# Patient Record
Sex: Male | Born: 1970 | Race: Black or African American | Hispanic: No | Marital: Married | Smoking: Never smoker
Health system: Southern US, Community
[De-identification: ages and names within clinical notes are randomized; demographics above are authoritative.]

## PROBLEM LIST (undated history)

## (undated) DIAGNOSIS — I10 Essential (primary) hypertension: Secondary | ICD-10-CM

## (undated) DIAGNOSIS — E785 Hyperlipidemia, unspecified: Secondary | ICD-10-CM

## (undated) DIAGNOSIS — K219 Gastro-esophageal reflux disease without esophagitis: Secondary | ICD-10-CM

---

## 2010-12-26 ENCOUNTER — Other Ambulatory Visit: Payer: Self-pay

## 2010-12-26 ENCOUNTER — Emergency Department (INDEPENDENT_AMBULATORY_CARE_PROVIDER_SITE_OTHER): Payer: BC Managed Care – PPO

## 2010-12-26 ENCOUNTER — Emergency Department (HOSPITAL_BASED_OUTPATIENT_CLINIC_OR_DEPARTMENT_OTHER)
Admission: EM | Admit: 2010-12-26 | Discharge: 2010-12-26 | Disposition: A | Discharge status: Home / Self Care | Payer: BC Managed Care – PPO | Attending: Emergency Medicine | Admitting: Emergency Medicine

## 2010-12-26 ENCOUNTER — Encounter: Payer: Self-pay | Admitting: *Deleted

## 2010-12-26 DIAGNOSIS — R079 Chest pain, unspecified: Secondary | ICD-10-CM | POA: Insufficient documentation

## 2010-12-26 DIAGNOSIS — R0789 Other chest pain: Secondary | ICD-10-CM

## 2010-12-26 HISTORY — DX: Hyperlipidemia, unspecified: E78.5

## 2010-12-26 LAB — BASIC METABOLIC PANEL
BUN: 13 mg/dL (ref 6–23)
CO2: 27 mEq/L (ref 19–32)
Chloride: 101 mEq/L (ref 96–112)
Glucose, Bld: 111 mg/dL — ABNORMAL HIGH (ref 70–99)
Potassium: 3.6 mEq/L (ref 3.5–5.1)

## 2010-12-26 LAB — CBC
HCT: 40.5 % (ref 39.0–52.0)
Hemoglobin: 13.3 g/dL (ref 13.0–17.0)
WBC: 6.6 10*3/uL (ref 4.0–10.5)

## 2010-12-26 MED ORDER — OMEPRAZOLE 20 MG PO CPDR: 20.0000 mg | DELAYED_RELEASE_CAPSULE | Freq: Every day | ORAL | Status: AC

## 2010-12-26 NOTE — ED Notes (Signed)
Pt presents to ED today with left sided chest pain taht started earlier and has been steady all day.  Pt has previous cardiac work up with no findings.  Pt reports no injury or trauma to chest.

## 2010-12-26 NOTE — ED Notes (Signed)
Pt c/o cp on and off x 2 days, described and cramping denies SOB or nausea

## 2010-12-26 NOTE — ED Provider Notes (Signed)
History     CSN: [id number] Arrival date & time: 12/26/2010  7:21 PM   First MD Initiated Contact with Patient 12/26/10 1928      Chief Complaint  Patient presents with  . Chest Pain    (Consider location/radiation/quality/duration/timing/severity/associated sxs/prior treatment) Patient is a 40 y.o. male presenting with chest pain. The history is provided by the patient and the spouse.  Chest Pain    patient reports occasional ache in the left side of his chest without radiation x2 days.  Today he had an episode that lasted 4-5 minutes.  There was no associated shortness of breath, diaphoresis palpitations, near syncope,  Syncope.  His symptoms are not worsened by exertion.  There is not a position that worsens his pain.  Worsened by palpation.  He's had no trauma to his chest.  He denies coughing congestion fever and chills.  He denies unilateral leg swelling.  His no family history of early cardiac disease his only cardiac risk factors hyperlipidemia.  No history of DVT or PE.  The patient reports a normal stress test 3 years ago done by a cardiologist in [facility].  His symptoms are moderate when they're present.  Currently he is without chest pain.  Past Medical History  Diagnosis Date  . Hyperlipemia     History reviewed. No pertinent past surgical history.  History reviewed. No pertinent family history.  History  Substance Use Topics  . Smoking status: Never Smoker   . Smokeless tobacco: Not on file  . Alcohol Use: No      Review of Systems  Cardiovascular: Positive for chest pain.  All other systems reviewed and are negative.    Allergies  Review of patient's allergies indicates no known allergies.  Home Medications   Current Outpatient Rx  Name Route Sig Dispense Refill  . ONE-DAILY MULTI VITAMINS PO TABS Oral Take 1 tablet by mouth daily.      Marland Kitchen SIMVASTATIN 10 MG PO TABS Oral Take 10 mg by mouth at bedtime.      . OMEPRAZOLE 20 MG PO CPDR  Oral Take 1 capsule (20 mg total) by mouth daily. 30 capsule 0    BP 132/82  Pulse 83  Temp(Src) 98.7 F (37.1 C) (Oral)  Resp 18  Ht 5\' 10"  (1.778 m)  Wt 250 lb (113.399 kg)  BMI 35.87 kg/m2  SpO2 100%  Physical Exam  Nursing note and vitals reviewed. Constitutional: He is oriented to person, place, and time. He appears well-developed and well-nourished.  HENT:  Head: Normocephalic and atraumatic.  Eyes: EOM are normal.  Neck: Normal range of motion.  Cardiovascular: Normal rate, regular rhythm, normal heart sounds and intact distal pulses.   Pulmonary/Chest: Effort normal and breath sounds normal. No respiratory distress.  Abdominal: Soft. He exhibits no distension. There is no tenderness.  Musculoskeletal: Normal range of motion.  Neurological: He is alert and oriented to person, place, and time.  Skin: Skin is warm and dry.  Psychiatric: He has a normal mood and affect. Judgment normal.    ED Course  Procedures (including critical care time)   Date: 12/26/2010  Rate: 77  Rhythm: normal sinus rhythm  QRS Axis: normal  Intervals: normal  ST/T Wave abnormalities: normal  Conduction Disutrbances:none  Narrative Interpretation:   Old EKG Reviewed: No significant changes noted     Labs Reviewed  CBC - Abnormal; Notable for the following:    MCV 70.7 (*)    MCH 23.2 (*)  All other components within normal limits  BASIC METABOLIC PANEL - Abnormal; Notable for the following:    Glucose, Bld 111 (*)    All other components within normal limits  TROPONIN I   Dg Chest 2 View  12/26/2010  *RADIOLOGY REPORT*  Clinical Data: Left chest pain  CHEST - 2 VIEW  Comparison: None  Findings: Normal heart size, mediastinal contours, and pulmonary vascularity. Mild peribronchial thickening. No pulmonary infiltrate, pleural effusion, or pneumothorax. Bones unremarkable.  IMPRESSION: Minimal bronchitic changes.  Original Report Authenticated By: Lollie Marrow, M.D.   I  personally reviewed the patient's chest x-ray  1. Chest pain       MDM  Lower is for ACS.  EKG and troponin are normal.  Atypical chest pain.  Chest x-ray clear.  No symptoms to suggest PE or DVT.  Patient has PERC negative.        Lyanne Co, MD 12/26/10 2203

## 2014-01-29 ENCOUNTER — Emergency Department (HOSPITAL_BASED_OUTPATIENT_CLINIC_OR_DEPARTMENT_OTHER)
Admission: EM | Admit: 2014-01-29 | Discharge: 2014-01-29 | Disposition: A | Discharge status: Home / Self Care | Payer: 59 | Attending: Emergency Medicine | Admitting: Emergency Medicine

## 2014-01-29 ENCOUNTER — Encounter (HOSPITAL_BASED_OUTPATIENT_CLINIC_OR_DEPARTMENT_OTHER): Payer: Self-pay

## 2014-01-29 ENCOUNTER — Emergency Department (HOSPITAL_BASED_OUTPATIENT_CLINIC_OR_DEPARTMENT_OTHER): Payer: 59

## 2014-01-29 DIAGNOSIS — K219 Gastro-esophageal reflux disease without esophagitis: Secondary | ICD-10-CM | POA: Insufficient documentation

## 2014-01-29 DIAGNOSIS — E785 Hyperlipidemia, unspecified: Secondary | ICD-10-CM | POA: Insufficient documentation

## 2014-01-29 DIAGNOSIS — Z7951 Long term (current) use of inhaled steroids: Secondary | ICD-10-CM | POA: Diagnosis not present

## 2014-01-29 DIAGNOSIS — R0602 Shortness of breath: Secondary | ICD-10-CM | POA: Insufficient documentation

## 2014-01-29 DIAGNOSIS — I10 Essential (primary) hypertension: Secondary | ICD-10-CM | POA: Diagnosis not present

## 2014-01-29 DIAGNOSIS — Z79899 Other long term (current) drug therapy: Secondary | ICD-10-CM | POA: Insufficient documentation

## 2014-01-29 HISTORY — DX: Essential (primary) hypertension: I10

## 2014-01-29 HISTORY — DX: Gastro-esophageal reflux disease without esophagitis: K21.9

## 2014-01-29 LAB — D-DIMER, QUANTITATIVE: D-Dimer, Quant: <0.27 ug/mL-FEU (ref 0.00–0.48)

## 2014-01-29 LAB — CBC WITH DIFFERENTIAL/PLATELET
BASOS ABS: 0 10*3/uL (ref 0.0–0.1)
Basophils Relative: 0 % (ref 0–1)
EOS ABS: 0.1 10*3/uL (ref 0.0–0.7)
Eosinophils Relative: 1 % (ref 0–5)
HEMATOCRIT: 41.7 % (ref 39.0–52.0)
Hemoglobin: 13.8 g/dL (ref 13.0–17.0)
LYMPHS ABS: 2.2 10*3/uL (ref 0.7–4.0)
LYMPHS PCT: 31 % (ref 12–46)
MCH: 23.8 pg — ABNORMAL LOW (ref 26.0–34.0)
MCHC: 33.1 g/dL (ref 30.0–36.0)
MCV: 72 fL — ABNORMAL LOW (ref 78.0–100.0)
Monocytes Absolute: 0.8 10*3/uL (ref 0.1–1.0)
Monocytes Relative: 11 % (ref 3–12)
NEUTROS ABS: 3.9 10*3/uL (ref 1.7–7.7)
Neutrophils Relative %: 57 % (ref 43–77)
PLATELETS: 307 10*3/uL (ref 150–400)
RBC: 5.79 MIL/uL (ref 4.22–5.81)
RDW: 14 % (ref 11.5–15.5)
WBC: 7 10*3/uL (ref 4.0–10.5)

## 2014-01-29 LAB — BASIC METABOLIC PANEL
ANION GAP: 6 (ref 5–15)
BUN: 12 mg/dL (ref 6–23)
CALCIUM: 9.2 mg/dL (ref 8.4–10.5)
CO2: 26 mmol/L (ref 19–32)
CREATININE: 0.91 mg/dL (ref 0.50–1.35)
Chloride: 105 mEq/L (ref 96–112)
GFR calc Af Amer: >90 mL/min (ref >90)
Glucose, Bld: 120 mg/dL — ABNORMAL HIGH (ref 70–99)
Potassium: 4.2 mmol/L (ref 3.5–5.1)
SODIUM: 137 mmol/L (ref 135–145)

## 2014-01-29 LAB — TROPONIN I

## 2014-01-29 LAB — BRAIN NATRIURETIC PEPTIDE: B Natriuretic Peptide: 14.8 pg/mL (ref 0.0–100.0)

## 2014-01-29 MED ORDER — ALBUTEROL SULFATE HFA 108 (90 BASE) MCG/ACT IN AERS: 2.0000 | INHALATION_SPRAY | RESPIRATORY_TRACT | Status: AC | PRN

## 2014-01-29 MED ORDER — PREDNISONE 50 MG PO TABS: 50.0000 mg | ORAL_TABLET | Freq: Every day | ORAL | Status: AC

## 2014-01-29 MED ORDER — IPRATROPIUM-ALBUTEROL 0.5-2.5 (3) MG/3ML IN SOLN
3.0000 mL | RESPIRATORY_TRACT | Status: DC
  Administered 2014-01-29: 3 mL via RESPIRATORY_TRACT
  Filled 2014-01-29: qty 3

## 2014-01-29 MED ORDER — PREDNISONE 10 MG PO TABS
60.0000 mg | ORAL_TABLET | Freq: Once | ORAL | Status: AC
  Administered 2014-01-29: 60 mg via ORAL
  Filled 2014-01-29 (×2): qty 1

## 2014-01-29 NOTE — ED Notes (Signed)
Pt c/o nasal congestion, difficulty breathing, worse laying flat and trying to sleep; states went PCP 2days ago,dx with acid reflux

## 2014-01-29 NOTE — Discharge Instructions (Signed)
I suspect that most of your shortness of breath is related to sleep apnea. To confirm this, he will need to be referred for a sleep study. This will have to be done by your PCP. Some of it might be due to some spasm in the bronchial tubes. You're being given a prescription for prednisone and an inhaler to treat this.  Shortness of Breath Shortness of breath means you have trouble breathing. It could also mean that you have a medical problem. You should get immediate medical care for shortness of breath. CAUSES   Not enough oxygen in the air such as with high altitudes or a smoke-filled room.  Certain lung diseases, infections, or problems.  Heart disease or conditions, such as angina or heart failure.  Low red blood cells (anemia).  Poor physical fitness, which can cause shortness of breath when you exercise.  Chest or back injuries or stiffness.  Being overweight.  Smoking.  Anxiety, which can make you feel like you are not getting enough air. DIAGNOSIS  Serious medical problems can often be found during your physical exam. Tests may also be done to determine why you are having shortness of breath. Tests may include: 1. Chest X-rays. 2. Lung function tests. 3. Blood tests. 4. An electrocardiogram (ECG). 5. An ambulatory electrocardiogram. An ambulatory ECG records your heartbeat patterns over a 24-hour period. 6. Exercise testing. 7. A transthoracic echocardiogram (TTE). During echocardiography, sound waves are used to evaluate how blood flows through your heart. 8. A transesophageal echocardiogram (TEE). 9. Imaging scans. Your health care provider may not be able to find a cause for your shortness of breath after your exam. In this case, it is important to have a follow-up exam with your health care provider as directed.  TREATMENT  Treatment for shortness of breath depends on the cause of your symptoms and can vary greatly. HOME CARE INSTRUCTIONS   Do not smoke. Smoking is a  common cause of shortness of breath. If you smoke, ask for help to quit.  Avoid being around chemicals or things that may bother your breathing, such as paint fumes and dust.  Rest as needed. Slowly resume your usual activities.  If medicines were prescribed, take them as directed for the full length of time directed. This includes oxygen and any inhaled medicines.  Keep all follow-up appointments as directed by your health care provider. SEEK MEDICAL CARE IF:   Your condition does not improve in the time expected.  You have a hard time doing your normal activities even with rest.  You have any new symptoms. SEEK IMMEDIATE MEDICAL CARE IF:   Your shortness of breath gets worse.  You feel light-headed, faint, or develop a cough not controlled with medicines.  You start coughing up blood.  You have pain with breathing.  You have chest pain or pain in your arms, shoulders, or abdomen.  You have a fever.  You are unable to walk up stairs or exercise the way you normally do. MAKE SURE YOU:  Understand these instructions.  Will watch your condition.  Will get help right away if you are not doing well or get worse. Document Released: 10/17/2000 Document Revised: 01/27/2013 Document Reviewed: 04/09/2011 [facility] Patient Information 2015 [place], [place]. This information is not intended to replace advice given to you by your health care provider. Make sure you discuss any questions you have with your health care provider.  Sleep Apnea  Sleep apnea is a sleep disorder characterized by abnormal pauses in  breathing while you sleep. When your breathing pauses, the level of oxygen in your blood decreases. This causes you to move out of deep sleep and into light sleep. As a result, your quality of sleep is poor, and the system that carries your blood throughout your body (cardiovascular system) experiences stress. If sleep apnea remains untreated, the following conditions can  develop:  High blood pressure (hypertension).  Coronary artery disease.  Inability to achieve or maintain an erection (impotence).  Impairment of your thought process (cognitive dysfunction). There are three types of sleep apnea: 10. Obstructive sleep apnea--Pauses in breathing during sleep because of a blocked airway. 11. Central sleep apnea--Pauses in breathing during sleep because the area of the brain that controls your breathing does not send the correct signals to the muscles that control breathing. 12. Mixed sleep apnea--A combination of both obstructive and central sleep apnea. RISK FACTORS The following risk factors can increase your risk of developing sleep apnea:  Being overweight.  Smoking.  Having narrow passages in your nose and throat.  Being of older age.  Being male.  Alcohol use.  Sedative and tranquilizer use.  Ethnicity. Among individuals younger than 35 years, African Americans are at increased risk of sleep apnea. SYMPTOMS   Difficulty staying asleep.  Daytime sleepiness and fatigue.  Loss of energy.  Irritability.  Loud, heavy snoring.  Morning headaches.  Trouble concentrating.  Forgetfulness.  Decreased interest in sex. DIAGNOSIS  In order to diagnose sleep apnea, your caregiver will perform a physical examination. Your caregiver may suggest that you take a home sleep test. Your caregiver may also recommend that you spend the night in a sleep lab. In the sleep lab, several monitors record information about your heart, lungs, and brain while you sleep. Your leg and arm movements and blood oxygen level are also recorded. TREATMENT The following actions may help to resolve mild sleep apnea:  Sleeping on your side.   Using a decongestant if you have nasal congestion.   Avoiding the use of depressants, including alcohol, sedatives, and narcotics.   Losing weight and modifying your diet if you are overweight. There also are devices  and treatments to help open your airway:  Oral appliances. These are custom-made mouthpieces that shift your lower jaw forward and slightly open your bite. This opens your airway.  Devices that create positive airway pressure. This positive pressure "splints" your airway open to help you breathe better during sleep. The following devices create positive airway pressure:  Continuous positive airway pressure (CPAP) device. The CPAP device creates a continuous level of air pressure with an air pump. The air is delivered to your airway through a mask while you sleep. This continuous pressure keeps your airway open.  Nasal expiratory positive airway pressure (EPAP) device. The EPAP device creates positive air pressure as you exhale. The device consists of single-use valves, which are inserted into each nostril and held in place by adhesive. The valves create very little resistance when you inhale but create much more resistance when you exhale. That increased resistance creates the positive airway pressure. This positive pressure while you exhale keeps your airway open, making it easier to breath when you inhale again.  Bilevel positive airway pressure (BPAP) device. The BPAP device is used mainly in patients with central sleep apnea. This device is similar to the CPAP device because it also uses an air pump to deliver continuous air pressure through a mask. However, with the BPAP machine, the pressure is set at  two different levels. The pressure when you exhale is lower than the pressure when you inhale.  Surgery. Typically, surgery is only done if you cannot comply with less invasive treatments or if the less invasive treatments do not improve your condition. Surgery involves removing excess tissue in your airway to create a wider passage way. Document Released: 01/12/2002 Document Revised: 05/19/2012 Document Reviewed: 05/31/2011 [facility] Patient Information 2015 [place]. This information is not  intended to replace advice given to you by your health care provider. Make sure you discuss any questions you have with your health care provider.  Albuterol inhalation aerosol What is this medicine? ALBUTEROL (al Gas[address] BiddingBYOO ter ole) is a bronchodilator. It helps open up the airways in your lungs to make it easier to breathe. This medicine is used to treat and to prevent bronchospasm. This medicine may be used for other purposes; ask your health care provider or pharmacist if you have questions. COMMON BRAND NAME(S): Proair HFA, Proventil, Proventil HFA, Respirol, Ventolin, Ventolin HFA What should I tell my health care provider before I take this medicine? They need to know if you have any of the following conditions: -diabetes -heart disease or irregular heartbeat -high blood pressure -pheochromocytoma -seizures -thyroid disease -an unusual or allergic reaction to albuterol, levalbuterol, sulfites, other medicines, foods, dyes, or preservatives -pregnant or trying to get pregnant -breast-feeding How should I use this medicine? This medicine is for inhalation through the mouth. Follow the directions on your prescription label. Take your medicine at regular intervals. Do not use more often than directed. Make sure that you are using your inhaler correctly. Ask you doctor or health care provider if you have any questions. Talk to your pediatrician regarding the use of this medicine in children. Special care may be needed. Overdosage: If you think you have taken too much of this medicine contact a poison control center or emergency room at once. NOTE: This medicine is only for you. Do not share this medicine with others. What if I miss a dose? If you miss a dose, use it as soon as you can. If it is almost time for your next dose, use only that dose. Do not use double or extra doses. What may interact with this medicine? -anti-infectives like chloroquine and  pentamidine -caffeine -cisapride -diuretics -medicines for colds -medicines for depression or for emotional or psychotic conditions -medicines for weight loss including some herbal products -methadone -some antibiotics like clarithromycin, erythromycin, levofloxacin, and linezolid -some heart medicines -steroid hormones like dexamethasone, cortisone, hydrocortisone -theophylline -thyroid hormones This list may not describe all possible interactions. Give your health care provider a list of all the medicines, herbs, non-prescription drugs, or dietary supplements you use. Also tell them if you smoke, drink alcohol, or use illegal drugs. Some items may interact with your medicine. What should I watch for while using this medicine? Tell your doctor or health care professional if your symptoms do not improve. Do not use extra albuterol. If your asthma or bronchitis gets worse while you are using this medicine, call your doctor right away. If your mouth gets dry try chewing sugarless gum or sucking hard candy. Drink water as directed. What side effects may I notice from receiving this medicine? Side effects that you should report to your doctor or health care professional as soon as possible: -allergic reactions like skin rash, itching or hives, swelling of the face, lips, or tongue -breathing problems -chest pain -feeling faint or lightheaded, falls -high blood pressure -irregular heartbeat -fever -  muscle cramps or weakness -pain, tingling, numbness in the hands or feet -vomiting Side effects that usually do not require medical attention (report to your doctor or health care professional if they continue or are bothersome): -cough -difficulty sleeping -headache -nervousness or trembling -stomach upset -stuffy or runny nose -throat irritation -unusual taste This list may not describe all possible side effects. Call your doctor for medical advice about side effects. You may report side  effects to FDA at 1-800-FDA-1088. Where should I keep my medicine? Keep out of the reach of children. Store at room temperature between 15 and 30 degrees C (59 and 86 degrees F). The contents are under pressure and may burst when exposed to heat or flame. Do not freeze. This medicine does not work as well if it is too cold. Throw away any unused medicine after the expiration date. Inhalers need to be thrown away after the labeled number of puffs have been used or by the expiration date; whichever comes first. Ventolin HFA should be thrown away 12 months after removing from foil pouch. Check the instructions that come with your medicine. NOTE: This sheet is a summary. It may not cover all possible information. If you have questions about this medicine, talk to your doctor, pharmacist, or health care provider.  2015, [employer] Standard. (2012-07-10 10:57:17)  Prednisone tablets What is this medicine? PREDNISONE (PRED ni sone) is a corticosteroid. It is commonly used to treat inflammation of the skin, joints, lungs, and other organs. Common conditions treated include asthma, allergies, and arthritis. It is also used for other conditions, such as blood disorders and diseases of the adrenal glands. This medicine may be used for other purposes; ask your health care provider or pharmacist if you have questions. COMMON BRAND NAME(S): Deltasone, Predone, Sterapred, Sterapred DS What should I tell my health care provider before I take this medicine? They need to know if you have any of these conditions: -Cushing's syndrome -diabetes -glaucoma -heart disease -high blood pressure -infection (especially a virus infection such as chickenpox, cold sores, or herpes) -kidney disease -liver disease -mental illness -myasthenia gravis -osteoporosis -seizures -stomach or intestine problems -thyroid disease -an unusual or allergic reaction to lactose, prednisone, other medicines, foods, dyes, or  preservatives -pregnant or trying to get pregnant -breast-feeding How should I use this medicine? Take this medicine by mouth with a glass of water. Follow the directions on the prescription label. Take this medicine with food. If you are taking this medicine once a day, take it in the morning. Do not take more medicine than you are told to take. Do not suddenly stop taking your medicine because you may develop a severe reaction. Your doctor will tell you how much medicine to take. If your doctor wants you to stop the medicine, the dose may be slowly lowered over time to avoid any side effects. Talk to your pediatrician regarding the use of this medicine in children. Special care may be needed. Overdosage: If you think you have taken too much of this medicine contact a poison control center or emergency room at once. NOTE: This medicine is only for you. Do not share this medicine with others. What if I miss a dose? If you miss a dose, take it as soon as you can. If it is almost time for your next dose, talk to your doctor or health care professional. You may need to miss a dose or take an extra dose. Do not take double or extra doses without advice. What may  interact with this medicine? Do not take this medicine with any of the following medications: -metyrapone -mifepristone This medicine may also interact with the following medications: -aminoglutethimide -amphotericin B -aspirin and aspirin-like medicines -barbiturates -certain medicines for diabetes, like glipizide or glyburide -cholestyramine -cholinesterase inhibitors -cyclosporine -digoxin -diuretics -ephedrine -male hormones, like estrogens and birth control pills -isoniazid -ketoconazole -NSAIDS, medicines for pain and inflammation, like ibuprofen or naproxen -phenytoin -rifampin -toxoids -vaccines -warfarin This list may not describe all possible interactions. Give your health care provider a list of all the medicines,  herbs, non-prescription drugs, or dietary supplements you use. Also tell them if you smoke, drink alcohol, or use illegal drugs. Some items may interact with your medicine. What should I watch for while using this medicine? Visit your doctor or health care professional for regular checks on your progress. If you are taking this medicine over a prolonged period, carry an identification card with your name and address, the type and dose of your medicine, and your doctor's name and address. This medicine may increase your risk of getting an infection. Tell your doctor or health care professional if you are around anyone with measles or chickenpox, or if you develop sores or blisters that do not heal properly. If you are going to have surgery, tell your doctor or health care professional that you have taken this medicine within the last twelve months. Ask your doctor or health care professional about your diet. You may need to lower the amount of salt you eat. This medicine may affect blood sugar levels. If you have diabetes, check with your doctor or health care professional before you change your diet or the dose of your diabetic medicine. What side effects may I notice from receiving this medicine? Side effects that you should report to your doctor or health care professional as soon as possible: -allergic reactions like skin rash, itching or hives, swelling of the face, lips, or tongue -changes in emotions or moods -changes in vision -depressed mood -eye pain -fever or chills, cough, sore throat, pain or difficulty passing urine -increased thirst -swelling of ankles, feet Side effects that usually do not require medical attention (report to your doctor or health care professional if they continue or are bothersome): -confusion, excitement, restlessness -headache -nausea, vomiting -skin problems, acne, thin and shiny skin -trouble sleeping -weight gain This list may not describe all possible  side effects. Call your doctor for medical advice about side effects. You may report side effects to FDA at 1-800-FDA-1088. Where should I keep my medicine? Keep out of the reach of children. Store at room temperature between 15 and 30 degrees C (59 and 86 degrees F). Protect from light. Keep container tightly closed. Throw away any unused medicine after the expiration date. NOTE: This sheet is a summary. It may not cover all possible information. If you have questions about this medicine, talk to your doctor, pharmacist, or health care provider.  2015, [employer] Standard. (2010-09-07 10:57:14)

## 2014-01-29 NOTE — ED Provider Notes (Addendum)
CSN: [id number]     Arrival date & time 01/29/14  0509 History   First MD Initiated Contact with Patient 01/29/14 0541     Chief Complaint  Patient presents with  . Shortness of Breath     (Consider location/radiation/quality/duration/timing/severity/associated sxs/prior Treatment) Patient is a 43 y.o. male presenting with shortness of breath. The history is provided by the patient.  Shortness of Breath He has been having difficulty breathing for the last 3 days. Difficulty breathing is worse at night and worse when he lays flat. He has had to sleep in a recliner. There's been a slight cough which is nonproductive. He denies fever, chills, sweats. He denies chest pain, heaviness, tightness, pressure. There is no nausea or vomiting. He did go to his PCP who informed him that he had acid reflux as the cause of his cough. Symptoms have not been worsening but they have not been improving either. He denies exertional symptoms. He has not noticed any swelling. He denies recent travel and recent surgery.  Past Medical History  Diagnosis Date  . Hyperlipemia   . Hypertension   . Acid reflux    History reviewed. No pertinent past surgical history. No family history on file. History  Substance Use Topics  . Smoking status: Never Smoker   . Smokeless tobacco: Not on file  . Alcohol Use: No    Review of Systems  Respiratory: Positive for shortness of breath.   All other systems reviewed and are negative.     Allergies  Review of patient's allergies indicates no known allergies.  Home Medications   Prior to Admission medications   Medication Sig Start Date End Date Taking? Authorizing Provider  fluticasone (FLONASE) 50 MCG/ACT nasal spray Place 1 spray into both nostrils daily.   Yes Historical Provider, MD  hydrochlorothiazide (HYDRODIURIL) 12.5 MG tablet Take 12.5 mg by mouth daily.   Yes Historical Provider, MD  Multiple Vitamin (MULTIVITAMIN) tablet Take 1 tablet by mouth daily.       Historical Provider, MD  omeprazole (PRILOSEC) 20 MG capsule Take 1 capsule (20 mg total) by mouth daily. 12/26/10 12/26/11  Lyanne CoKevin M Campos, MD  simvastatin (ZOCOR) 10 MG tablet Take 10 mg by mouth at bedtime.      Historical Provider, MD   BP 128/72 mmHg  Pulse 73  Temp(Src) 98.3 F (36.8 C) (Oral)  Resp 20  Ht 5\' 10"  (1.778 m)  Wt 270 lb (122.471 kg)  BMI 38.74 kg/m2  SpO2 100% Physical Exam  Nursing note and vitals reviewed.  43 year old male, resting comfortably and in no acute distress. Vital signs are normal. Oxygen saturation is 100%, which is normal. Head is normocephalic and atraumatic. PERRLA, EOMI. Oropharynx is clear. Neck is nontender and supple without adenopathy or JVD. Back is nontender and there is no CVA tenderness. Lungs are clear without rales, wheezes, or rhonchi. Chest is nontender. Heart has regular rate and rhythm without murmur. Abdomen is soft, flat, nontender without masses or hepatosplenomegaly and peristalsis is normoactive. Extremities have no cyanosis or edema, full range of motion is present. Skin is warm and dry without rash. Neurologic: Mental status is normal, cranial nerves are intact, there are no motor or sensory deficits.  ED Course  Procedures (including critical care time) Labs Review Results for orders placed or performed during the hospital encounter of 01/29/14  Basic metabolic panel  Result Value Ref Range   Sodium 137 135 - 145 mmol/L   Potassium 4.2 3.5 - 5.1  mmol/L   Chloride 105 96 - 112 mEq/L   CO2 26 19 - 32 mmol/L   Glucose, Bld 120 (H) 70 - 99 mg/dL   BUN 12 6 - 23 mg/dL   Creatinine, Ser 0.98 0.50 - 1.35 mg/dL   Calcium 9.2 8.4 - 11.9 mg/dL   GFR calc non Af Amer >90 >90 mL/min   GFR calc Af Amer >90 >90 mL/min   Anion gap 6 5 - 15  CBC with Differential  Result Value Ref Range   WBC 7.0 4.0 - 10.5 K/uL   RBC 5.79 4.22 - 5.81 MIL/uL   Hemoglobin 13.8 13.0 - 17.0 g/dL   HCT 14.7 82.9 - 56.2 %   MCV 72.0 (L)  78.0 - 100.0 fL   MCH 23.8 (L) 26.0 - 34.0 pg   MCHC 33.1 30.0 - 36.0 g/dL   RDW 13.0 86.5 - 78.4 %   Platelets 307 150 - 400 K/uL   Neutrophils Relative % 57 43 - 77 %   Lymphocytes Relative 31 12 - 46 %   Monocytes Relative 11 3 - 12 %   Eosinophils Relative 1 0 - 5 %   Basophils Relative 0 0 - 1 %   Neutro Abs 3.9 1.7 - 7.7 K/uL   Lymphs Abs 2.2 0.7 - 4.0 K/uL   Monocytes Absolute 0.8 0.1 - 1.0 K/uL   Eosinophils Absolute 0.1 0.0 - 0.7 K/uL   Basophils Absolute 0.0 0.0 - 0.1 K/uL   Smear Review MORPHOLOGY UNREMARKABLE   Brain natriuretic peptide  Result Value Ref Range   B Natriuretic Peptide 14.8 0.0 - 100.0 pg/mL  Troponin I  Result Value Ref Range   Troponin I <0.03 <0.031 ng/mL  D-dimer, quantitative  Result Value Ref Range   D-Dimer, Quant <0.27 0.00 - 0.48 ug/mL-FEU   Imaging Review Dg Chest 2 View  01/29/2014   CLINICAL DATA:  Difficulty breathing and acute onset of cough. Initial encounter.  EXAM: CHEST  2 VIEW  COMPARISON:  Chest radiograph from 10/20/2012  FINDINGS: The lungs are well-aerated. Minimal right basilar opacity likely reflects atelectasis. There is no evidence of pleural effusion or pneumothorax.  The heart is normal in size; the mediastinal contour is within normal limits. No acute osseous abnormalities are seen.  IMPRESSION: Minimal right basilar opacity likely reflects atelectasis; lungs otherwise clear.   Electronically Signed   By: Roanna Raider M.D.   On: 01/29/2014 06:02     EKG Interpretation   Date/Time:  Friday January 29 2014 06:03:28 EST Ventricular Rate:  73 PR Interval:  126 QRS Duration: 92 QT Interval:  376 QTC Calculation: 414 R Axis:   47 Text Interpretation:  Normal sinus rhythm T wave abnormality, consider  inferior ischemia Abnormal ECG When compared with ECG of 12/26/2010, T  wave abnormality is more pronounced Confirmed by [facility]  MD, Maylani Embree ([id number])  on 01/29/2014 6:13:26 AM      MDM   Final diagnoses:  Shortness of  breath    Dyspnea of uncertain cause. Symptoms worse with being supine are concerning for possible congestive heart failure. Screening evaluation done including ECG, chest x-ray laboratory evaluation. Review of old records shows no relevant past visits.  Dione Booze, MD 01/29/14 [phone]  Workup is unremarkable. Chest x-ray is normal and troponin and d-dimer and BNP are all normal. He was given a therapeutic trial of albuterol with ipratropium and he did get some slight relief. Further history is obtained and his wife states that he  snores loudly through the night and he frequently wakes up feeling like he has not rested. What is different recently is that he has been waking up with feeling like he can't breathe and he is worried to go to sleep. Symptoms are suggestive of obstructive sleep apnea. I discussed this with patient and recommended that he talk with his PCP about getting referred for sleep study and possibly getting a CPAP mask if that is appropriate. Patient expresses understanding. Since he did have slight response to albuterol with ipratropium, he is discharged with prescription for albuterol inhaler and prednisone.  Dione Boozeavid Cainan Trull, MD 01/29/14 [phone]

## 2016-05-10 IMAGING — CR DG CHEST 2V
2 series · 2 of 2 positions shown · non-contrast
Comparison: Chest radiograph from 10/20/2012

CLINICAL DATA: Difficulty breathing and acute onset of cough.
Initial encounter.

EXAM:
CHEST  2 VIEW

[w chest pa]
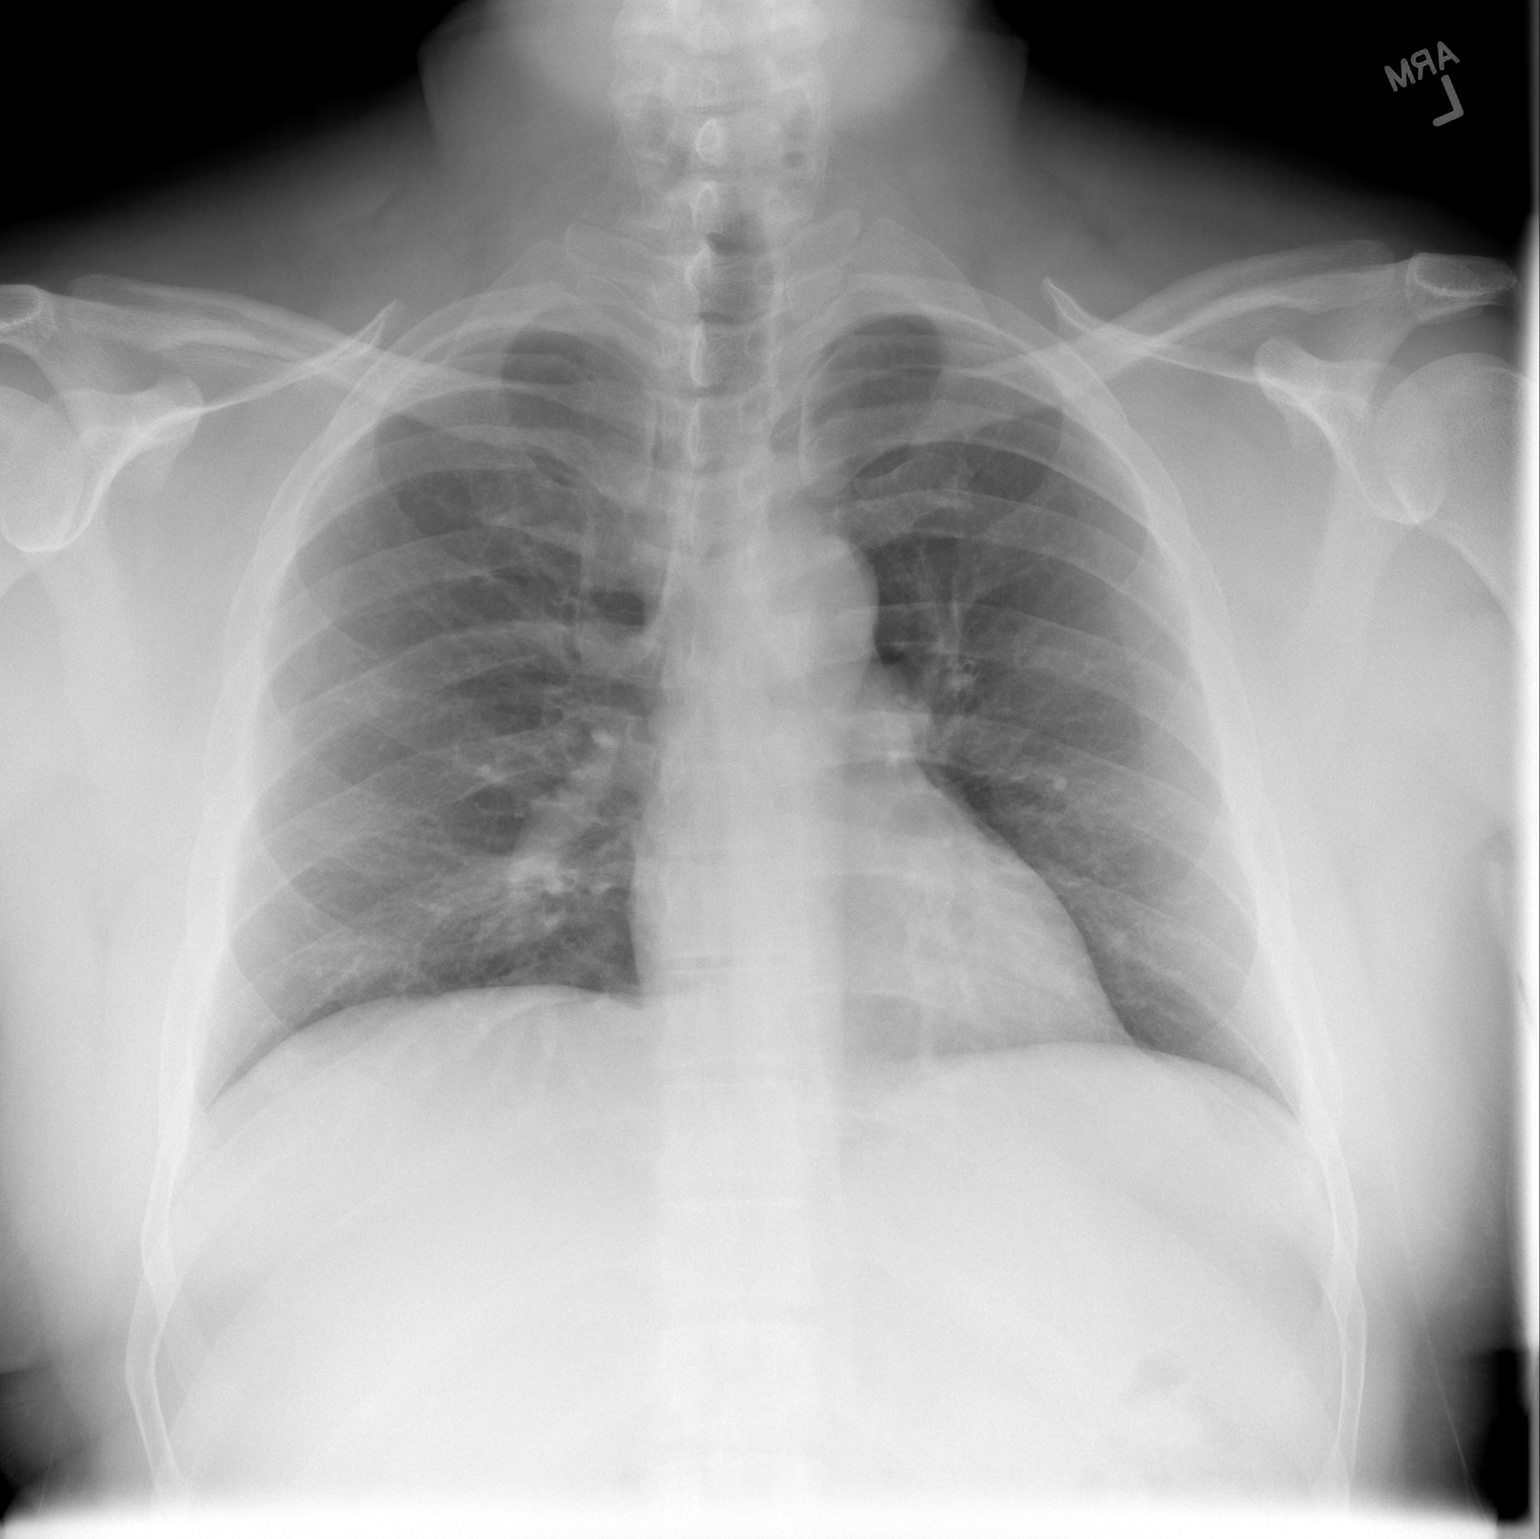

[w chest lat]
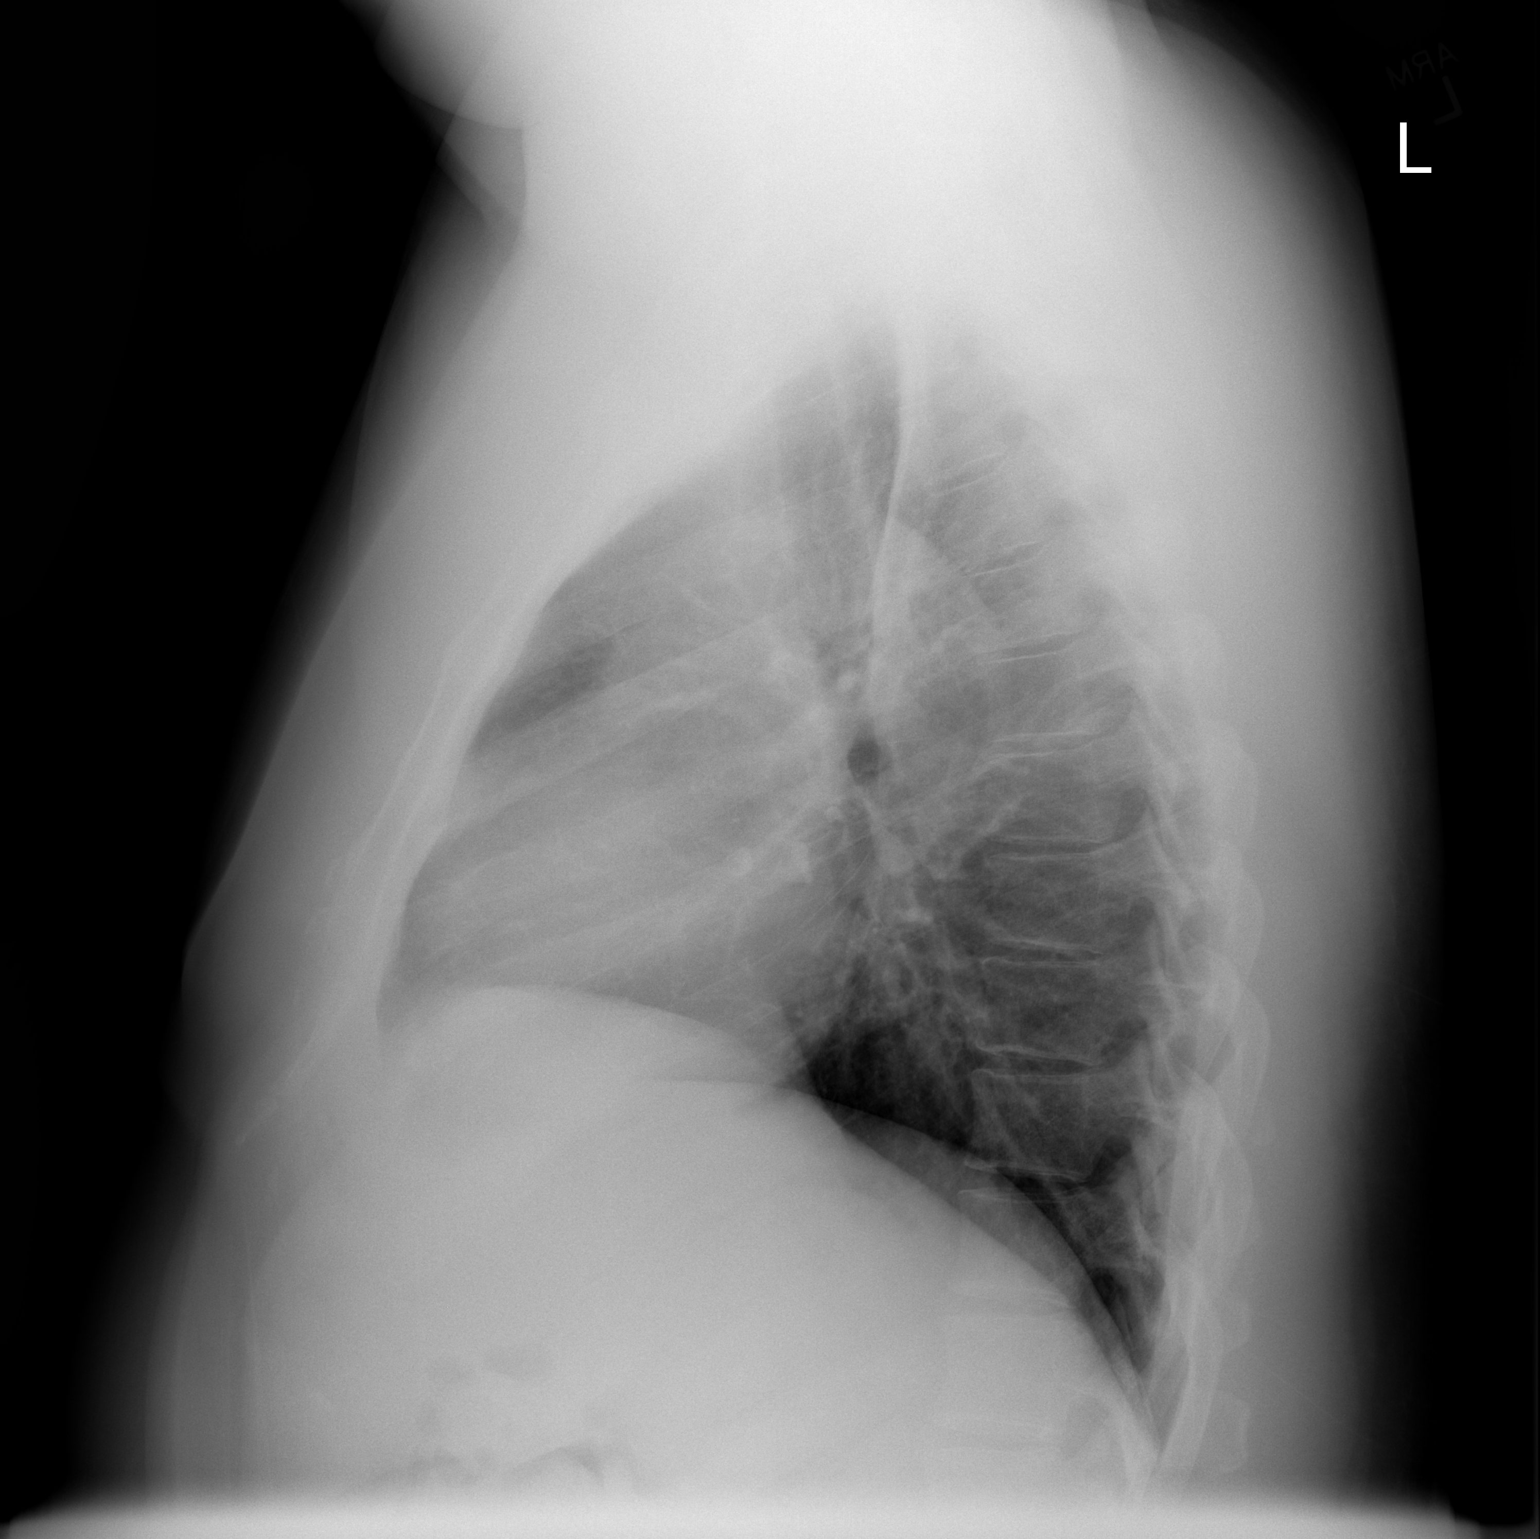

[2 of 2 positions shown; findings below may reference images not displayed]

FINDINGS: The lungs are well-aerated. Minimal right basilar opacity likely
reflects atelectasis. There is no evidence of pleural effusion or
pneumothorax.

The heart is normal in size; the mediastinal contour is within
normal limits. No acute osseous abnormalities are seen.
IMPRESSION: Minimal right basilar opacity likely reflects atelectasis; lungs
otherwise clear.

## 2019-12-04 ENCOUNTER — Other Ambulatory Visit: Payer: Self-pay

## 2019-12-04 ENCOUNTER — Emergency Department (HOSPITAL_BASED_OUTPATIENT_CLINIC_OR_DEPARTMENT_OTHER)
Admission: EM | Admit: 2019-12-04 | Discharge: 2019-12-04 | Disposition: A | Discharge status: Home / Self Care | Payer: BC Managed Care – PPO | Attending: Emergency Medicine | Admitting: Emergency Medicine

## 2019-12-04 DIAGNOSIS — R42 Dizziness and giddiness: Secondary | ICD-10-CM | POA: Insufficient documentation

## 2019-12-04 DIAGNOSIS — I10 Essential (primary) hypertension: Secondary | ICD-10-CM | POA: Diagnosis not present

## 2019-12-04 LAB — CBG MONITORING, ED: Glucose-Capillary: 150 mg/dL — ABNORMAL HIGH (ref 70–99)

## 2019-12-04 NOTE — ED Provider Notes (Signed)
[facility] Emergency Department Provider Note MRN:  [id number]  Arrival date & time: 12/04/19     Chief Complaint   Light headed   History of Present Illness   Mario Poole is a 49 y.o. year-old male with a history of hypertension presenting to the ED with chief complaint of lightheaded.  1 month of intermittent lightheadedness, used to be infrequent and would improve with eating a meal.  Has become more frequent and nearly constant for the past few days.  Was seen by primary care doctor with laboratory assessment 2 days ago and was reassured.  Here for second opinion because he is having continued symptoms.  Denies any headache or vision change, no chest pain or shortness of breath, no abdominal pain, no numbness or weakness to the arms or legs, no nausea or vomiting.  Review of Systems  A complete 10 system review of systems was obtained and all systems are negative except as noted in the HPI and PMH.   Patient's Health History    Past Medical History:  Diagnosis Date  . Acid reflux   . Hyperlipemia   . Hypertension     No past surgical history on file.  No family history on file.  Social History   Socioeconomic History  . Marital status: Married    Spouse name: Not on file  . Number of children: Not on file  . Years of education: Not on file  . Highest education level: Not on file  Occupational History  . Not on file  Tobacco Use  . Smoking status: Never Smoker  Substance and Sexual Activity  . Alcohol use: No  . Drug use: No  . Sexual activity: Never  Other Topics Concern  . Not on file  Social History Narrative  . Not on file   Social Determinants of Health   Financial Resource Strain:   . Difficulty of Paying Living Expenses: Not on file  Food Insecurity:   . Worried About Programme researcher, broadcasting/film/video in the Last Year: Not on file  . Ran Out of Food in the Last Year: Not on file  Transportation Needs:   . Lack of Transportation  (Medical): Not on file  . Lack of Transportation (Non-Medical): Not on file  Physical Activity:   . Days of Exercise per Week: Not on file  . Minutes of Exercise per Session: Not on file  Stress:   . Feeling of Stress : Not on file  Social Connections:   . Frequency of Communication with Friends and Family: Not on file  . Frequency of Social Gatherings with Friends and Family: Not on file  . Attends Religious Services: Not on file  . Active Member of Clubs or Organizations: Not on file  . Attends Banker Meetings: Not on file  . Marital Status: Not on file  Intimate Partner Violence:   . Fear of Current or Ex-Partner: Not on file  . Emotionally Abused: Not on file  . Physically Abused: Not on file  . Sexually Abused: Not on file     Physical Exam   Vitals:   12/04/19 0922  BP: (!) 143/75  Pulse: 76  Resp: 18  Temp: 98 F (36.7 C)  SpO2: 99%    CONSTITUTIONAL: Well-appearing, NAD NEURO:  Alert and oriented x 3, normal and symmetric strength and sensation, normal coordination, normal speech EYES:  eyes equal and reactive ENT/NECK:  no LAD, no JVD CARDIO: Regular rate, well-perfused, normal S1  and S2 PULM:  CTAB no wheezing or rhonchi GI/GU:  normal bowel sounds, non-distended, non-tender MSK/SPINE:  No gross deformities, no edema SKIN:  no rash, atraumatic PSYCH:  Appropriate speech and behavior  *Additional and/or pertinent findings included in MDM below  Diagnostic and Interventional Summary    EKG Interpretation  Date/Time:  Friday December 04 2019 09:59:01 EDT Ventricular Rate:  70 PR Interval:    QRS Duration: 98 QT Interval:  358 QTC Calculation: 387 R Axis:   45 Text Interpretation: Sinus rhythm Borderline T abnormalities, diffuse leads Confirmed by Kennis Carina [id number]) on 12/04/2019 10:25:45 AM      Labs Reviewed  CBG MONITORING, ED - Abnormal; Notable for the following components:      Result Value   Glucose-Capillary 150 (*)    All  other components within normal limits    No orders to display    Medications - No data to display   Procedures  /  Critical Care Procedures  ED Course and Medical Decision Making  I have reviewed the triage vital signs, the nursing notes, and pertinent available records from the EMR.  Listed above are laboratory and imaging tests that I personally ordered, reviewed, and interpreted and then considered in my medical decision making (see below for details).  No chest pain or shortness of breath to suggest ACS or PE, EKG is reassuring, had laboratory assessment 2 days ago with a negative D-dimer.  No evidence of DVT on exam.  Neurological exam is normal and patient does not describe a vertigo type picture, more of a mild lightheadedness.  Seems to improve with meals.  Blood sugar is normal here.  Overall very well-appearing, nothing to suggest an emergent process, appropriate for PCP follow-up.       Elmer Sow. Pilar Plate, MD [facility] Emergency Medicine [facility] [email or web address]  Final Clinical Impressions(s) / ED Diagnoses     ICD-10-CM   1. Lightheadedness  R42     ED Discharge Orders    None       Discharge Instructions Discussed with and Provided to Patient:     Discharge Instructions     You were evaluated in the Emergency Department and after careful evaluation, we did not find any emergent condition requiring admission or further testing in the hospital.  Your exam/testing today is overall reassuring.  EKG showed that your heart looks normal.  Your blood sugar was also normal.  As discussed, we recommend increasing water intake and having frequent smaller meals through the day to try to manage her symptoms and following up closely with your primary care doctor.  Please return to the Emergency Department if you experience any worsening of your condition.   Thank you for allowing [place] to be a part of your care.      Sabas Sous, MD 12/04/19  1029

## 2019-12-04 NOTE — ED Notes (Signed)
BEFAST / VAN negative, gait very steady when ambulating to exam room

## 2019-12-04 NOTE — Discharge Instructions (Addendum)
You were evaluated in the Emergency Department and after careful evaluation, we did not find any emergent condition requiring admission or further testing in the hospital.  Your exam/testing today is overall reassuring.  EKG showed that your heart looks normal.  Your blood sugar was also normal.  As discussed, we recommend increasing water intake and having frequent smaller meals through the day to try to manage her symptoms and following up closely with your primary care doctor.  Please return to the Emergency Department if you experience any worsening of your condition.   Thank you for allowing [place] to be a part of your care.

## 2019-12-04 NOTE — ED Triage Notes (Signed)
Has been feeling light headed, went to MD two days ago, feels better after eating, first experience was approx 1 month ago

## 2019-12-04 NOTE — ED Notes (Signed)
AVS provided to pt, discussed recommendations per EDP, copy of AVS provided to client as well
